# Patient Record
Sex: Male | Born: 1960 | Race: White | Hispanic: No | Marital: Married | Smoking: Current every day smoker
Health system: Southern US, Community
[De-identification: ages and names within clinical notes are randomized; demographics above are authoritative.]

## PROBLEM LIST (undated history)

## (undated) DIAGNOSIS — I10 Essential (primary) hypertension: Secondary | ICD-10-CM

---

## 2012-08-18 ENCOUNTER — Ambulatory Visit: Payer: Self-pay | Admitting: Gastroenterology

## 2017-01-19 ENCOUNTER — Ambulatory Visit
Admission: EM | Admit: 2017-01-19 | Discharge: 2017-01-19 | Disposition: A | Discharge status: Home / Self Care | Payer: BLUE CROSS/BLUE SHIELD | Attending: Family Medicine | Admitting: Family Medicine

## 2017-01-19 DIAGNOSIS — L237 Allergic contact dermatitis due to plants, except food: Secondary | ICD-10-CM | POA: Diagnosis not present

## 2017-01-19 DIAGNOSIS — L299 Pruritus, unspecified: Secondary | ICD-10-CM | POA: Diagnosis not present

## 2017-01-19 HISTORY — DX: Essential (primary) hypertension: I10

## 2017-01-19 MED ORDER — PREDNISONE 20 MG PO TABS: ORAL_TABLET | ORAL | 0 refills | Status: AC

## 2017-01-19 NOTE — ED Triage Notes (Signed)
P was working in the yard and yesterday he noticed small whelps that are now itching and causing him issues with sleeping. Its on his arms and abdomen.

## 2017-01-19 NOTE — ED Provider Notes (Signed)
[facility]    CSN: [id number] Arrival date & time: 01/19/17  1815     History   Chief Complaint Chief Complaint  Patient presents with  . Poison Ivy    HPI Daryl Hale is a 56 y.o. male.   56 yo male with a c/o rash to arms and left side trunk since yesterday after exposure to poison ivy. Has not applied any medications. Complains of intense itching. Denies any wheezing, shortness of breath, chest pains or swelling.    The history is provided by the patient.  Poison Ivy     Past Medical History:  Diagnosis Date  . Hypertension     There are no active problems to display for this patient.   History reviewed. No pertinent surgical history.     Home Medications    Prior to Admission medications   Medication Sig Start Date End Date Taking? Authorizing Provider  predniSONE (DELTASONE) 20 MG tablet 3 tabs po qd x 2 days, then 2 tabs po qd x 3 days, then 1 tab po qd x 3 days, then half a tab po qd x 2 days 01/19/17   Payton Mccallum, MD    Family History History reviewed. No pertinent family history.  Social History Social History  Substance Use Topics  . Smoking status: Never Smoker  . Smokeless tobacco: Never Used  . Alcohol use No     Allergies   Patient has no known allergies.   Review of Systems Review of Systems   Physical Exam Triage Vital Signs ED Triage Vitals  Enc Vitals Group     BP 01/19/17 1836 (!) 169/79     Pulse Rate 01/19/17 1836 62     Resp 01/19/17 1836 18     Temp 01/19/17 1836 98.2 F (36.8 C)     Temp Source 01/19/17 1836 Oral     SpO2 01/19/17 1836 98 %     Weight 01/19/17 1837 175 lb (79.4 kg)     Height 01/19/17 1837  (1.854 m)     Head Circumference --      Peak Flow --      Pain Score 01/19/17 1837 1     Pain Loc --      Pain Edu? --      Excl. in GC? --    No data found.   Updated Vital Signs BP (!) 169/79 (BP Location: Left Arm)   Pulse 62   Temp 98.2 F (36.8 C) (Oral)   Resp 18    Ht  (1.854 m)   Wt 175 lb (79.4 kg)   SpO2 98%   BMI 23.09 kg/m   Visual Acuity Right Eye Distance:   Left Eye Distance:   Bilateral Distance:    Right Eye Near:   Left Eye Near:    Bilateral Near:     Physical Exam  Constitutional: He appears well-developed and well-nourished. No distress.  Skin: Lesion and rash noted. Rash is vesicular. He is not diaphoretic. There is erythema.     Nursing note and vitals reviewed.    UC Treatments / Results  Labs (all labs ordered are listed, but only abnormal results are displayed) Labs Reviewed - No data to display  EKG  EKG Interpretation None       Radiology No results found.  Procedures Procedures (including critical care time)  Medications Ordered in UC Medications - No data to display   Initial Impression / Assessment and Plan /  UC Course  I have reviewed the triage vital signs and the nursing notes.  Pertinent labs & imaging results that were available during my care of the patient were reviewed by me and considered in my medical decision making (see chart for details).       Final Clinical Impressions(s) / UC Diagnoses   Final diagnoses:  Contact dermatitis due to poison ivy    New Prescriptions New Prescriptions   PREDNISONE (DELTASONE) 20 MG TABLET    3 tabs po qd x 2 days, then 2 tabs po qd x 3 days, then 1 tab po qd x 3 days, then half a tab po qd x 2 days   1. diagnosis reviewed with patient 2. rx as per orders above; reviewed possible side effects, interactions, risks and benefits  3. Recommend supportive treatment with otc antihistamines prn 4. Follow-up prn if symptoms worsen or don't improve   Payton Mccallum, MD 01/19/17 1927

## 2017-03-11 ENCOUNTER — Telehealth: Payer: Self-pay | Admitting: *Deleted

## 2017-03-11 NOTE — Telephone Encounter (Signed)
Received referral for low dose lung cancer screening CT scan. Attempted to leave message at phone numbers listed in EMR and on physician referral form for patient to call me back to facilitate scheduling scan. However, there is no voicemail option. Will attempt contact at later date.

## 2017-03-23 ENCOUNTER — Telehealth: Payer: Self-pay | Admitting: *Deleted

## 2017-03-23 NOTE — Telephone Encounter (Signed)
Unable to contact patient for evaluation of lung screening scan at numbers provided by referring provider or in the EMR. Letter will be sent to patient.

## 2017-03-25 ENCOUNTER — Encounter: Payer: Self-pay | Admitting: *Deleted

## 2017-04-07 ENCOUNTER — Telehealth: Payer: Self-pay | Admitting: *Deleted

## 2017-04-07 DIAGNOSIS — Z87891 Personal history of nicotine dependence: Secondary | ICD-10-CM

## 2017-04-07 NOTE — Telephone Encounter (Signed)
Received referral for initial lung cancer screening scan. Contacted patient and obtained smoking history,(current, 40 pack year) as well as answering questions related to screening process. Patient denies signs of lung cancer such as weight loss or hemoptysis. Patient denies comorbidity that would prevent curative treatment if lung cancer were found. Patient is scheduled for shared decision making visit and CT scan on 04/21/17.

## 2017-04-21 ENCOUNTER — Inpatient Hospital Stay: Payer: BLUE CROSS/BLUE SHIELD | Attending: Oncology | Admitting: Oncology

## 2017-04-21 ENCOUNTER — Encounter: Payer: Self-pay | Admitting: Oncology

## 2017-04-21 ENCOUNTER — Ambulatory Visit
Admission: RE | Admit: 2017-04-21 | Discharge: 2017-04-21 | Disposition: A | Discharge status: Home / Self Care | Payer: BLUE CROSS/BLUE SHIELD | Source: Ambulatory Visit | Attending: Oncology | Admitting: Oncology

## 2017-04-21 DIAGNOSIS — Z87891 Personal history of nicotine dependence: Secondary | ICD-10-CM | POA: Insufficient documentation

## 2017-04-21 DIAGNOSIS — F17210 Nicotine dependence, cigarettes, uncomplicated: Secondary | ICD-10-CM

## 2017-04-21 DIAGNOSIS — J439 Emphysema, unspecified: Secondary | ICD-10-CM | POA: Insufficient documentation

## 2017-04-21 DIAGNOSIS — Z122 Encounter for screening for malignant neoplasm of respiratory organs: Secondary | ICD-10-CM | POA: Diagnosis not present

## 2017-04-21 DIAGNOSIS — I700 Atherosclerosis of aorta: Secondary | ICD-10-CM | POA: Diagnosis not present

## 2017-04-21 NOTE — Progress Notes (Signed)
In accordance with CMS guidelines, patient has met eligibility criteria including age, absence of signs or symptoms of lung cancer.  Social History  Substance Use Topics  . Smoking status: Current Every Day Smoker    Packs/day: 1.00    Years: 40.00  . Smokeless tobacco: Never Used  . Alcohol use No     A shared decision-making session was conducted prior to the performance of CT scan. This includes one or more decision aids, includes benefits and harms of screening, follow-up diagnostic testing, over-diagnosis, false positive rate, and total radiation exposure.  Counseling on the importance of adherence to annual lung cancer LDCT screening, impact of co-morbidities, and ability or willingness to undergo diagnosis and treatment is imperative for compliance of the program.  Counseling on the importance of continued smoking cessation for former smokers; the importance of smoking cessation for current smokers, and information about tobacco cessation interventions have been given to patient including [place] and [employer] quit [place] programs.  Written order for lung cancer screening with LDCT has been given to the patient and any and all questions have been answered to the best of my abilities.   Yearly follow up will be coordinated by Burgess Estelle, Thoracic Navigator.  Faythe Casa, NP 04/21/2017 1:17 PM

## 2017-04-23 ENCOUNTER — Telehealth: Payer: Self-pay | Admitting: *Deleted

## 2017-04-23 NOTE — Telephone Encounter (Signed)
Notified patient of LDCT lung cancer screening program results with recommendation for 12 month follow up imaging. Also notified of incidental findings noted below and is encouraged to discuss further with PCP who will receive a copy of this note and/or the CT report. Patient verbalizes understanding.   IMPRESSION: 1. Lung-RADS 2, benign appearance or behavior. Continue annual screening with low-dose chest CT without contrast in 12 months. 2. Aortic Atherosclerosis (ICD10-I70.0) and Emphysema (ICD10-

## 2018-05-06 ENCOUNTER — Telehealth: Payer: Self-pay | Admitting: *Deleted

## 2018-05-06 DIAGNOSIS — Z87891 Personal history of nicotine dependence: Secondary | ICD-10-CM

## 2018-05-06 DIAGNOSIS — Z122 Encounter for screening for malignant neoplasm of respiratory organs: Secondary | ICD-10-CM

## 2018-05-06 NOTE — Telephone Encounter (Signed)
Notified patient that annual lung cancer screening low dose CT scan is due currently or will be in near future. Confirmed that patient is within the age range of 55-77, and asymptomatic, (no signs or symptoms of lung cancer). Patient denies illness that would prevent curative treatment for lung cancer if found. Verified smoking history, (current, 40.5 pack year). The shared decision making visit was done 04/21/17. Patient is agreeable for CT scan being scheduled.

## 2018-05-12 ENCOUNTER — Ambulatory Visit
Admission: RE | Admit: 2018-05-12 | Discharge: 2018-05-12 | Disposition: A | Discharge status: Home / Self Care | Payer: BLUE CROSS/BLUE SHIELD | Source: Ambulatory Visit | Attending: Oncology | Admitting: Oncology

## 2018-05-12 DIAGNOSIS — F17210 Nicotine dependence, cigarettes, uncomplicated: Secondary | ICD-10-CM | POA: Diagnosis not present

## 2018-05-12 DIAGNOSIS — R918 Other nonspecific abnormal finding of lung field: Secondary | ICD-10-CM | POA: Insufficient documentation

## 2018-05-12 DIAGNOSIS — I700 Atherosclerosis of aorta: Secondary | ICD-10-CM | POA: Diagnosis not present

## 2018-05-12 DIAGNOSIS — J438 Other emphysema: Secondary | ICD-10-CM | POA: Insufficient documentation

## 2018-05-12 DIAGNOSIS — Z87891 Personal history of nicotine dependence: Secondary | ICD-10-CM

## 2018-05-12 DIAGNOSIS — J984 Other disorders of lung: Secondary | ICD-10-CM | POA: Insufficient documentation

## 2018-05-12 DIAGNOSIS — Z122 Encounter for screening for malignant neoplasm of respiratory organs: Secondary | ICD-10-CM | POA: Diagnosis not present

## 2018-05-14 ENCOUNTER — Encounter: Payer: Self-pay | Admitting: *Deleted

## 2019-05-18 ENCOUNTER — Telehealth: Payer: Self-pay | Admitting: *Deleted

## 2019-05-18 DIAGNOSIS — Z87891 Personal history of nicotine dependence: Secondary | ICD-10-CM

## 2019-05-18 DIAGNOSIS — Z122 Encounter for screening for malignant neoplasm of respiratory organs: Secondary | ICD-10-CM

## 2019-05-18 NOTE — Telephone Encounter (Signed)
Patient has been notified that annual lung cancer screening low dose CT scan is due currently or will be in near future. Confirmed that patient is within the age range of 55-77, and asymptomatic, (no signs or symptoms of lung cancer). Patient denies illness that would prevent curative treatment for lung cancer if found. Verified smoking history, (current, 41 pack year). The shared decision making visit was done 04/21/17. Patient is agreeable for CT scan being scheduled.

## 2019-05-25 ENCOUNTER — Ambulatory Visit
Admission: RE | Admit: 2019-05-25 | Discharge: 2019-05-25 | Disposition: A | Discharge status: Home / Self Care | Payer: BC Managed Care – PPO | Source: Ambulatory Visit | Attending: Oncology | Admitting: Oncology

## 2019-05-25 ENCOUNTER — Other Ambulatory Visit: Payer: Self-pay

## 2019-05-25 DIAGNOSIS — Z87891 Personal history of nicotine dependence: Secondary | ICD-10-CM | POA: Diagnosis present

## 2019-05-25 DIAGNOSIS — Z122 Encounter for screening for malignant neoplasm of respiratory organs: Secondary | ICD-10-CM | POA: Diagnosis not present

## 2019-05-27 ENCOUNTER — Encounter: Payer: Self-pay | Admitting: *Deleted

## 2020-05-14 ENCOUNTER — Telehealth: Payer: Self-pay | Admitting: *Deleted

## 2020-05-14 ENCOUNTER — Other Ambulatory Visit: Payer: Self-pay | Admitting: *Deleted

## 2020-05-14 DIAGNOSIS — Z87891 Personal history of nicotine dependence: Secondary | ICD-10-CM

## 2020-05-14 DIAGNOSIS — Z122 Encounter for screening for malignant neoplasm of respiratory organs: Secondary | ICD-10-CM

## 2020-05-14 NOTE — Telephone Encounter (Signed)
Writer spoke with patient on this date regarding annual lung cancer screening CT scan. Patient reported that he still smokes about a ppd, has not had any major health changes in the past year, and is not undergoing evaluation or treatment for cancer. Patient did state that he has had an insurance change and now has [employer]. CT has been scheduled for 06-07-20 at 8:00 and patient is aware of appt.

## 2020-06-07 ENCOUNTER — Other Ambulatory Visit: Payer: Self-pay

## 2020-06-07 ENCOUNTER — Ambulatory Visit
Admission: RE | Admit: 2020-06-07 | Discharge: 2020-06-07 | Disposition: A | Discharge status: Home / Self Care | Payer: 59 | Source: Ambulatory Visit | Attending: Nurse Practitioner | Admitting: Nurse Practitioner

## 2020-06-07 DIAGNOSIS — Z122 Encounter for screening for malignant neoplasm of respiratory organs: Secondary | ICD-10-CM

## 2020-06-07 DIAGNOSIS — Z87891 Personal history of nicotine dependence: Secondary | ICD-10-CM | POA: Diagnosis present

## 2020-06-14 ENCOUNTER — Encounter: Payer: Self-pay | Admitting: *Deleted

## 2021-07-30 ENCOUNTER — Other Ambulatory Visit: Payer: Self-pay | Admitting: *Deleted

## 2021-07-30 DIAGNOSIS — F17210 Nicotine dependence, cigarettes, uncomplicated: Secondary | ICD-10-CM

## 2021-08-02 ENCOUNTER — Ambulatory Visit: Payer: Commercial Managed Care - HMO

## 2021-08-27 ENCOUNTER — Ambulatory Visit
Admission: RE | Admit: 2021-08-27 | Discharge: 2021-08-27 | Disposition: A | Discharge status: Home / Self Care | Payer: 59 | Source: Ambulatory Visit | Attending: Acute Care | Admitting: Acute Care

## 2021-08-27 ENCOUNTER — Ambulatory Visit: Payer: 59

## 2021-08-27 ENCOUNTER — Other Ambulatory Visit: Payer: Self-pay

## 2021-08-27 DIAGNOSIS — F17210 Nicotine dependence, cigarettes, uncomplicated: Secondary | ICD-10-CM | POA: Insufficient documentation

## 2021-09-03 ENCOUNTER — Other Ambulatory Visit: Payer: Self-pay | Admitting: Acute Care

## 2021-09-03 DIAGNOSIS — F17210 Nicotine dependence, cigarettes, uncomplicated: Secondary | ICD-10-CM

## 2021-09-03 DIAGNOSIS — Z87891 Personal history of nicotine dependence: Secondary | ICD-10-CM

## 2022-08-27 ENCOUNTER — Ambulatory Visit: Payer: 59 | Attending: *Deleted

## 2022-09-02 ENCOUNTER — Ambulatory Visit
Admission: RE | Admit: 2022-09-02 | Discharge: 2022-09-02 | Disposition: A | Discharge status: Home / Self Care | Payer: 59 | Source: Ambulatory Visit | Attending: Acute Care | Admitting: Acute Care

## 2022-09-02 DIAGNOSIS — F17210 Nicotine dependence, cigarettes, uncomplicated: Secondary | ICD-10-CM | POA: Insufficient documentation

## 2022-09-02 DIAGNOSIS — Z87891 Personal history of nicotine dependence: Secondary | ICD-10-CM | POA: Diagnosis present

## 2022-09-03 ENCOUNTER — Telehealth: Payer: Self-pay | Admitting: Acute Care

## 2022-09-03 DIAGNOSIS — R911 Solitary pulmonary nodule: Secondary | ICD-10-CM

## 2022-09-03 DIAGNOSIS — F17210 Nicotine dependence, cigarettes, uncomplicated: Secondary | ICD-10-CM

## 2022-09-03 NOTE — Telephone Encounter (Signed)
I have called the patient woth the results of his low dose CT Chest. I expained that his scan was read as a Lung  RADS 3, nodules that are probably benign findings, short term follow up suggested: includes nodules with a low likelihood of becoming a clinically active cancer. Radiology recommends a 6 month repeat LDCT follow up. He has a new 4.9 mm nodule . Patient agreed to these recommendations. Angelique Blonder, please place order for a 6 month follow up scan and fax results to PCP with plan of care. Thanks so much.

## 2022-09-03 NOTE — Telephone Encounter (Signed)
Received call report from [facility] with [facility] on patient's lung cancer screen CT done on 09/02/22. Sarah please review the result/impression copied below:  IMPRESSION: 1. Lung-RADS 3, probably benign findings. Short-term follow-up in 6 months is recommended with repeat low-dose chest CT without contrast (please use the following order, "CT CHEST LCS NODULE FOLLOW-UP W/O CM"). New left lower lobe pulmonary nodule of volume derived equivalent diameter 4.9 mm. 2. Aortic atherosclerosis (ICD10-I70.0), coronary artery atherosclerosis and emphysema (ICD10-J43.9).  Please advise, thank you.

## 2022-09-03 NOTE — Telephone Encounter (Addendum)
Opened in error

## 2022-09-03 NOTE — Telephone Encounter (Signed)
Will call through [facility]. We have him on our dashboard.

## 2022-09-04 NOTE — Telephone Encounter (Signed)
Results/plan faxed to PCP.  Order placed for Lung nodule follow up LDCT in 6 months

## 2023-03-03 ENCOUNTER — Ambulatory Visit: Admission: RE | Admit: 2023-03-03 | Payer: 59 | Source: Ambulatory Visit

## 2023-03-09 ENCOUNTER — Ambulatory Visit: Payer: 59

## 2023-03-09 DIAGNOSIS — K640 First degree hemorrhoids: Secondary | ICD-10-CM

## 2023-03-09 DIAGNOSIS — Z1211 Encounter for screening for malignant neoplasm of colon: Secondary | ICD-10-CM | POA: Diagnosis not present

## 2023-03-09 DIAGNOSIS — K5730 Diverticulosis of large intestine without perforation or abscess without bleeding: Secondary | ICD-10-CM

## 2023-11-03 IMAGING — CT CT CHEST LUNG CANCER SCREENING LOW DOSE W/O CM
2 of 5 series · 15 of 40 positions shown, 18 images · non-contrast
Comparison: 06/07/2020

CLINICAL DATA: Forty-two pack-year smoking history. Current smoker.

EXAM:
CT CHEST WITHOUT CONTRAST LOW-DOSE FOR LUNG CANCER SCREENING
TECHNIQUE: Multidetector CT imaging of the chest was performed following the
standard protocol without IV contrast.

[Series 3: lung 1.00 · axial · 0.62mm/px · z∈[-1365,-1045]mm · 12 of 353 slices shown, 15 images]
[im 17/353  mediastinal]
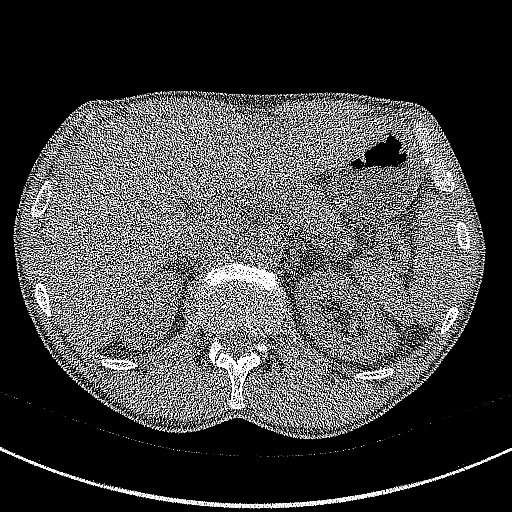
[im 17/353  lung]
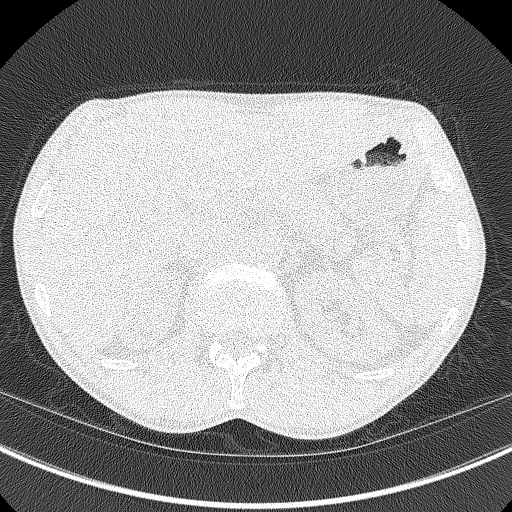
[im 49/353  lung]
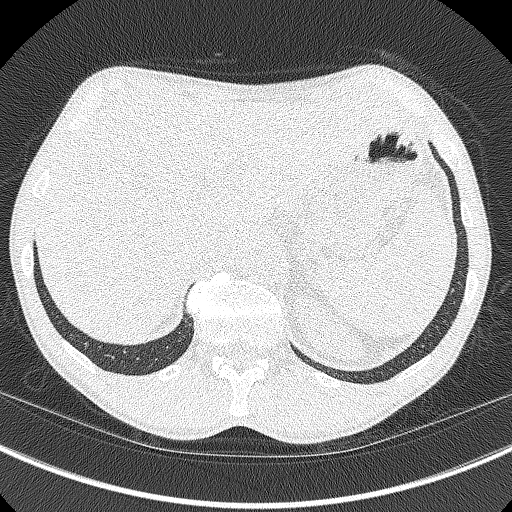
[im 81/353  lung]
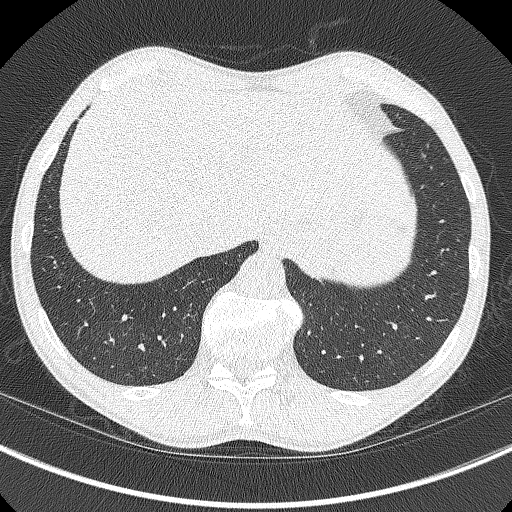
[im 113/353  lung]
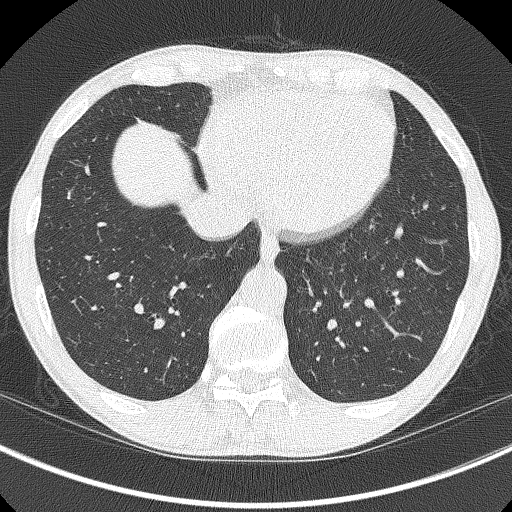
[im 129/353  mediastinal]
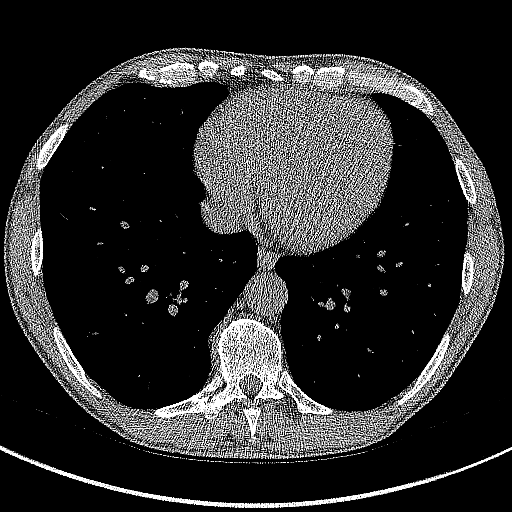
[im 129/353  lung]
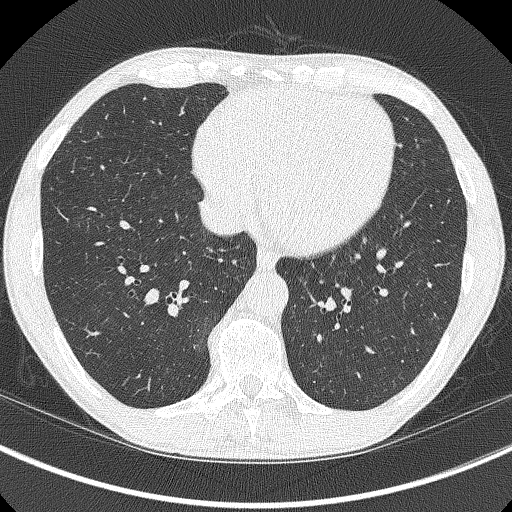
[im 161/353  lung]
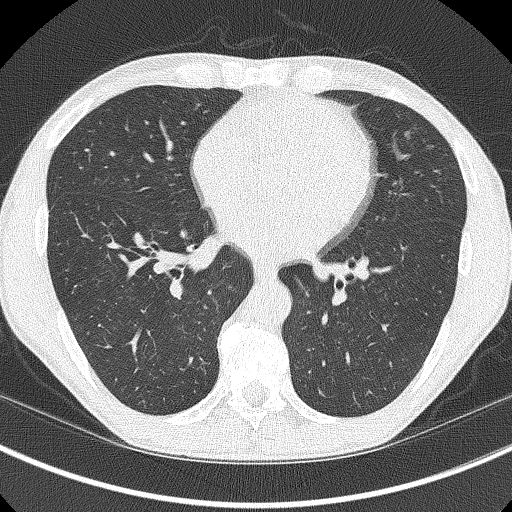
[im 193/353  lung]
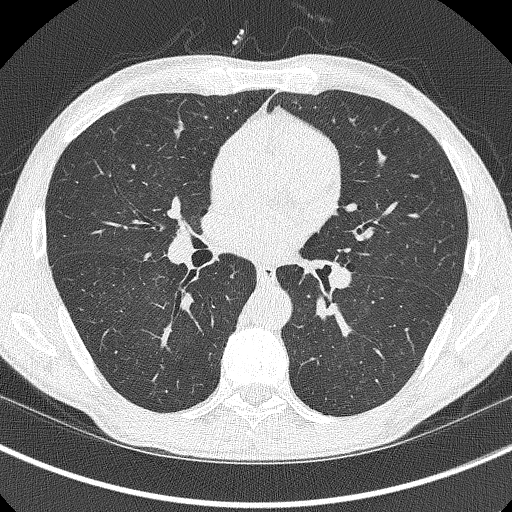
[im 225/353  lung]
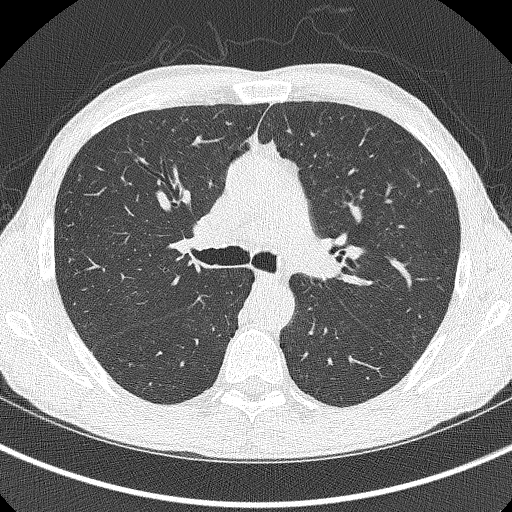
[im 241/353  mediastinal]
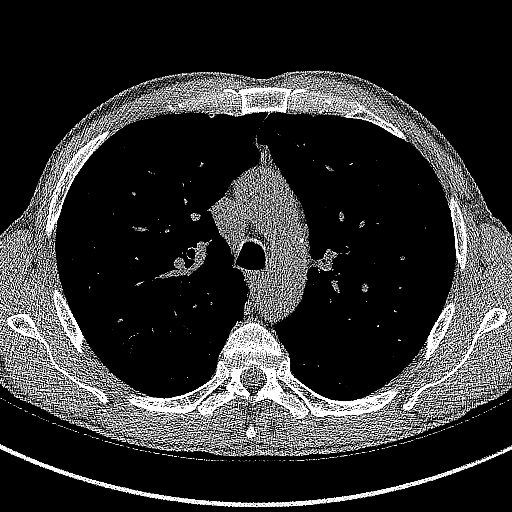
[im 241/353  lung]
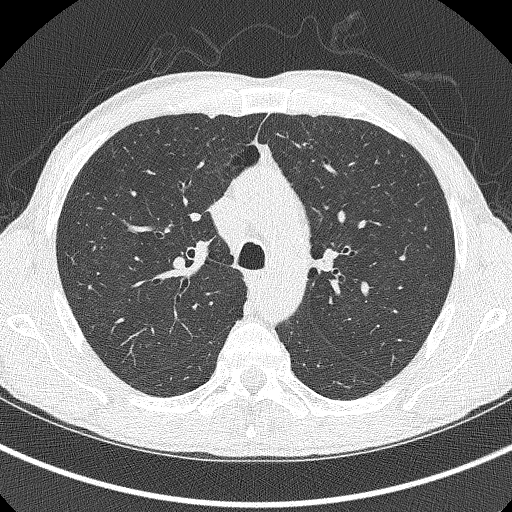
[im 273/353  lung]
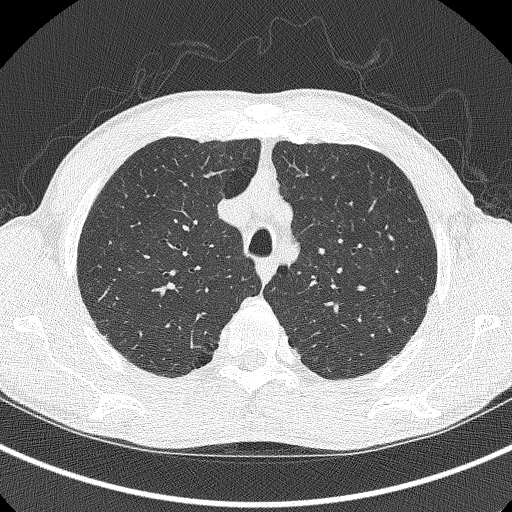
[im 305/353  lung]
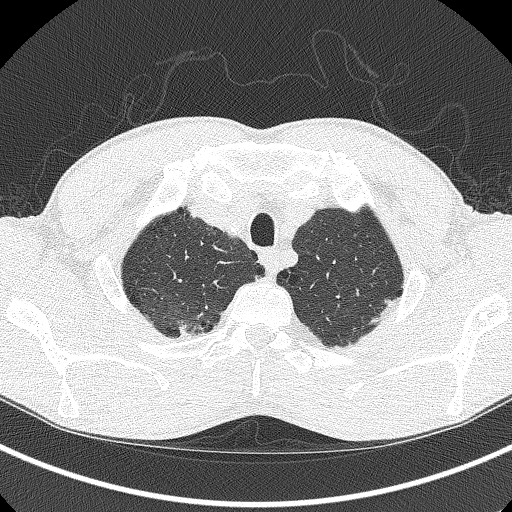
[im 337/353  lung]
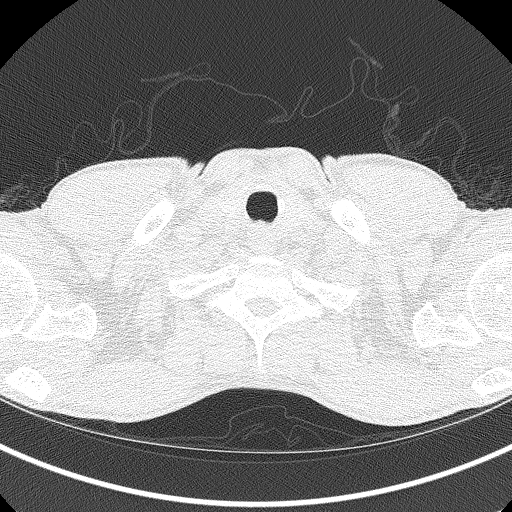

[Series 5: coronals lung 1.00 cor · coronal · 0.62mm/px · 3 of 257 slices shown]
[im 52/257  lung]
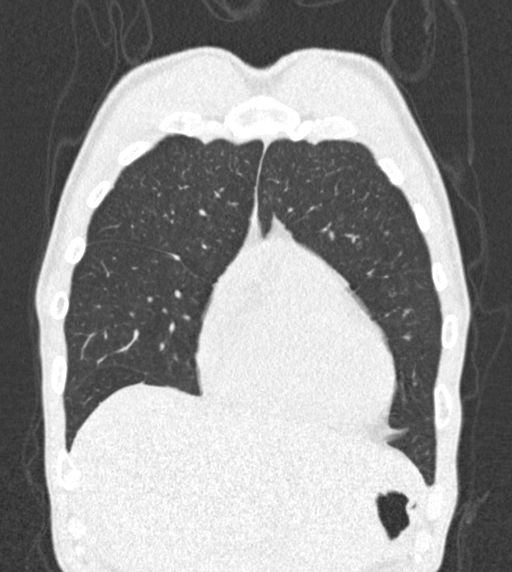
[im 103/257  lung]
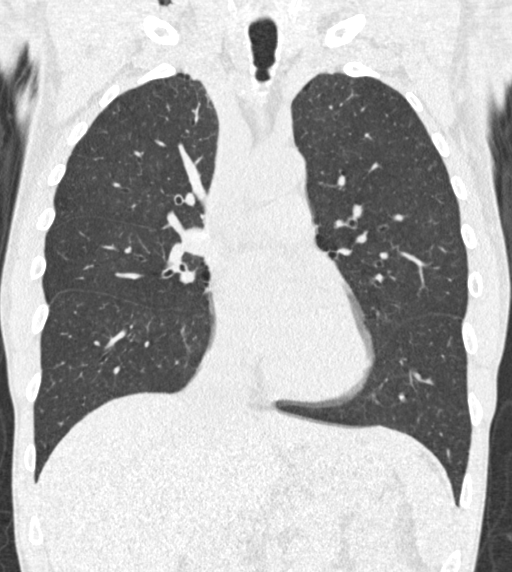
[im 154/257  lung]
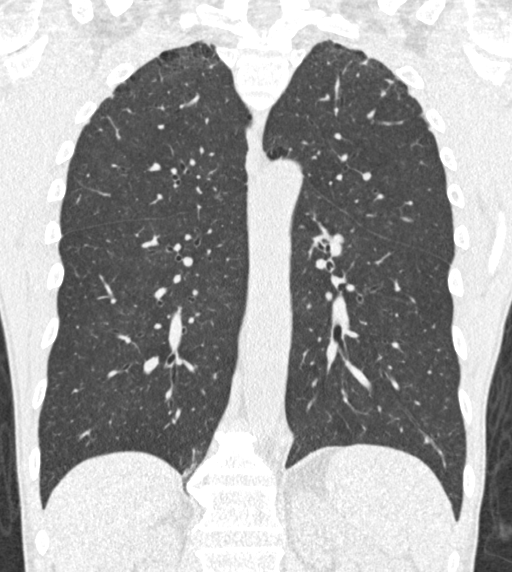

[15 of 40 positions shown; findings below may reference images not displayed]

FINDINGS: Cardiovascular: Bovine arch. Aortic atherosclerosis. Normal heart
size, without pericardial effusion. Lad coronary artery
calcification.

Mediastinum/Nodes: No mediastinal or definite hilar adenopathy,
given limitations of unenhanced CT.

Lungs/Pleura: No pleural fluid. Mild centrilobular and paraseptal
emphysema. Pulmonary nodules of maximally volume derived equivalent
diameter 3.9 mm, similar.

Upper Abdomen: Normal imaged portions of the liver, spleen, stomach,
pancreas, adrenal glands, kidneys.

Musculoskeletal: No acute osseous abnormality.
IMPRESSION: 1. Lung-RADS 2, benign appearance or behavior. Continue annual
screening with low-dose chest CT without contrast in 12 months.
2. Aortic Atherosclerosis (7CLT6-4K8.8) and Emphysema (7CLT6-3RM.0).
Coronary artery atherosclerosis.

## 2024-02-09 ENCOUNTER — Encounter: Payer: Self-pay | Admitting: *Deleted

## 2024-02-16 ENCOUNTER — Other Ambulatory Visit: Payer: Self-pay

## 2024-02-16 DIAGNOSIS — Z87891 Personal history of nicotine dependence: Secondary | ICD-10-CM

## 2024-02-16 DIAGNOSIS — Z122 Encounter for screening for malignant neoplasm of respiratory organs: Secondary | ICD-10-CM

## 2024-02-16 DIAGNOSIS — F17210 Nicotine dependence, cigarettes, uncomplicated: Secondary | ICD-10-CM

## 2024-03-02 ENCOUNTER — Ambulatory Visit
Admission: RE | Admit: 2024-03-02 | Discharge: 2024-03-02 | Disposition: A | Discharge status: Home / Self Care | Source: Ambulatory Visit | Attending: Acute Care | Admitting: Acute Care

## 2024-03-02 DIAGNOSIS — Z87891 Personal history of nicotine dependence: Secondary | ICD-10-CM

## 2024-03-02 DIAGNOSIS — F17210 Nicotine dependence, cigarettes, uncomplicated: Secondary | ICD-10-CM | POA: Diagnosis present

## 2024-03-02 DIAGNOSIS — I700 Atherosclerosis of aorta: Secondary | ICD-10-CM | POA: Insufficient documentation

## 2024-03-02 DIAGNOSIS — J439 Emphysema, unspecified: Secondary | ICD-10-CM | POA: Diagnosis not present

## 2024-03-02 DIAGNOSIS — Z122 Encounter for screening for malignant neoplasm of respiratory organs: Secondary | ICD-10-CM | POA: Insufficient documentation

## 2024-03-22 ENCOUNTER — Other Ambulatory Visit: Payer: Self-pay

## 2024-03-29 ENCOUNTER — Other Ambulatory Visit: Payer: Self-pay | Admitting: Acute Care

## 2024-03-29 DIAGNOSIS — F17210 Nicotine dependence, cigarettes, uncomplicated: Secondary | ICD-10-CM

## 2024-03-29 DIAGNOSIS — Z87891 Personal history of nicotine dependence: Secondary | ICD-10-CM

## 2024-03-29 DIAGNOSIS — Z122 Encounter for screening for malignant neoplasm of respiratory organs: Secondary | ICD-10-CM
